# Patient Record
Sex: Female | Born: 1990 | Race: White | Hispanic: No | Marital: Single | Smoking: Never smoker
Health system: Southern US, Community
[De-identification: ages and names within clinical notes are randomized; demographics above are authoritative.]

## PROBLEM LIST (undated history)

## (undated) ENCOUNTER — Inpatient Hospital Stay (HOSPITAL_COMMUNITY): Payer: Self-pay

## (undated) DIAGNOSIS — I959 Hypotension, unspecified: Secondary | ICD-10-CM

## (undated) HISTORY — PX: DILATION AND CURETTAGE OF UTERUS: SHX78

---
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
## 2010-07-27 LAB — POCT URINALYSIS DIP (DEVICE)
Hgb urine dipstick: NEGATIVE
Nitrite: NEGATIVE
Urobilinogen, UA: 0.2 mg/dL (ref 0.0–1.0)
pH: 5.5 (ref 5.0–8.0)

## 2010-07-27 LAB — GC/CHLAMYDIA PROBE AMP, GENITAL: Chlamydia, DNA Probe: NEGATIVE

## 2010-07-27 LAB — HIV ANTIBODY (ROUTINE TESTING W REFLEX): HIV: NONREACTIVE

## 2011-12-01 ENCOUNTER — Emergency Department (HOSPITAL_COMMUNITY)
Admission: EM | Admit: 2011-12-01 | Discharge: 2011-12-01 | Disposition: A | Discharge status: Home / Self Care | Payer: Self-pay | Attending: Emergency Medicine | Admitting: Emergency Medicine

## 2011-12-01 ENCOUNTER — Encounter (HOSPITAL_COMMUNITY): Payer: Self-pay | Admitting: Emergency Medicine

## 2011-12-01 DIAGNOSIS — H6690 Otitis media, unspecified, unspecified ear: Secondary | ICD-10-CM | POA: Insufficient documentation

## 2011-12-01 DIAGNOSIS — J029 Acute pharyngitis, unspecified: Secondary | ICD-10-CM | POA: Insufficient documentation

## 2011-12-01 MED ORDER — IBUPROFEN 800 MG PO TABS: 800.0000 mg | ORAL_TABLET | Freq: Three times a day (TID) | ORAL | Status: AC

## 2011-12-01 MED ORDER — AMOXICILLIN 500 MG PO CAPS: 1000.0000 mg | ORAL_CAPSULE | Freq: Two times a day (BID) | ORAL | Status: AC

## 2011-12-01 NOTE — ED Notes (Signed)
Pt alert, arrives from home, c/o left ear pain, onset was this evening, resp even unlabored, skin pwd

## 2011-12-01 NOTE — ED Provider Notes (Signed)
History     CSN: [id number]  Arrival date & time 12/01/11  0502   First MD Initiated Contact with Patient 12/01/11 [phone]      Chief Complaint  Patient presents with  . Otalgia    (Consider location/radiation/quality/duration/timing/severity/associated sxs/prior treatment) HPI Comments: Kendra Gamble 21 y.o. female   The chief complaint is: Patient presents with:   Otalgia   The patient has medical history significant for:   History reviewed. No pertinent past medical history   Patient presents for sore throat that began last night and L ear pain that began this morning.She rates the pain 9/10. She reports having three ear infections as a adult and one that perforated. Denies ear discharge. Denies recent swimming Denies fever, chills. Denies NVD, abdominal pain.    The history is provided by the patient.    History reviewed. No pertinent past medical history.  History reviewed. No pertinent past surgical history.  No family history on file.  History  Substance Use Topics  . Smoking status: Never Smoker   . Smokeless tobacco: Not on file  . Alcohol Use: No    OB History    Grav Para Term Preterm Abortions TAB SAB Ect Mult Living                  Review of Systems  Constitutional: Negative for fever and chills.  HENT: Positive for ear pain and sore throat. Negative for hearing loss and ear discharge.   Gastrointestinal: Negative for nausea, vomiting, abdominal pain and diarrhea.  All other systems reviewed and are negative.    Allergies  Review of patient's allergies indicates no known allergies.  Home Medications   Current Outpatient Rx  Name Route Sig Dispense Refill  . ADULT MULTIVITAMIN W/MINERALS CH Oral Take 1 tablet by mouth daily.      BP 120/92  Pulse 76  Temp 98 F (36.7 C) (Oral)  Resp 16  Wt 97 lb (43.999 kg)  SpO2 98%  LMP 10/31/2011  Physical Exam  Nursing note and vitals reviewed. Constitutional: She appears well-developed  and well-nourished. No distress.  HENT:  Head: Normocephalic and atraumatic.  Right Ear: Hearing, tympanic membrane, external ear and ear canal normal.  Left Ear: Hearing, external ear and ear canal normal. Tympanic membrane is erythematous.  Ears:  Mouth/Throat: No oropharyngeal exudate.       Oropharynx mildly erythematous.  Eyes: Conjunctivae and EOM are normal. No scleral icterus.  Neck: Normal range of motion. Neck supple.  Cardiovascular: Normal rate, regular rhythm and normal heart sounds.   Pulmonary/Chest: Effort normal and breath sounds normal.  Abdominal: Soft. Bowel sounds are normal. There is no tenderness.  Lymphadenopathy:    She has no cervical adenopathy.  Skin: Skin is warm and dry.    ED Course  Procedures (including critical care time)  Labs Reviewed - No data to display No results found.   1. Otitis media       MDM  Patient presented with sore throat x 1 day and ear pain that began this am. Patient afebrile. Patient has history of  Adult otitis media and on physical exam L TM has mild injection. Patient discharged on ibuprofen and amoxicillin. No red flags or cholesteatoma, mastoiditis, or otitis externa. Return precautions given verbally and in discharge instructions.        Pixie Casino, PA-C 12/01/11 [phone]

## 2011-12-02 NOTE — ED Provider Notes (Signed)
Medical screening examination/treatment/procedure(s) were performed by non-physician practitioner and as supervising physician I was immediately available for consultation/collaboration.  Aldora Perman, MD 12/02/11 0816 

## 2012-11-29 ENCOUNTER — Emergency Department (INDEPENDENT_AMBULATORY_CARE_PROVIDER_SITE_OTHER)
Admission: EM | Admit: 2012-11-29 | Discharge: 2012-11-29 | Disposition: A | Discharge status: Home / Self Care | Payer: Self-pay | Source: Home / Self Care | Attending: Family Medicine | Admitting: Family Medicine

## 2012-11-29 ENCOUNTER — Encounter (HOSPITAL_COMMUNITY): Payer: Self-pay | Admitting: Emergency Medicine

## 2012-11-29 DIAGNOSIS — R42 Dizziness and giddiness: Secondary | ICD-10-CM

## 2012-11-29 HISTORY — DX: Hypotension, unspecified: I95.9

## 2012-11-29 MED ORDER — METAXALONE 800 MG PO TABS: 800.0000 mg | ORAL_TABLET | Freq: Three times a day (TID) | ORAL | Status: DC

## 2012-11-29 MED ORDER — IBUPROFEN 800 MG PO TABS: 800.0000 mg | ORAL_TABLET | Freq: Three times a day (TID) | ORAL | Status: DC
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
## 2015-05-05 ENCOUNTER — Inpatient Hospital Stay (HOSPITAL_COMMUNITY): Payer: Self-pay

## 2015-05-05 ENCOUNTER — Encounter (HOSPITAL_COMMUNITY): Payer: Self-pay | Admitting: Student

## 2015-05-05 ENCOUNTER — Inpatient Hospital Stay (HOSPITAL_COMMUNITY)
Admission: AD | Admit: 2015-05-05 | Discharge: 2015-05-05 | Disposition: A | Discharge status: Home / Self Care | Payer: Self-pay | Source: Ambulatory Visit | Attending: Obstetrics and Gynecology | Admitting: Obstetrics and Gynecology

## 2015-05-05 DIAGNOSIS — N760 Acute vaginitis: Secondary | ICD-10-CM | POA: Insufficient documentation

## 2015-05-05 DIAGNOSIS — O23591 Infection of other part of genital tract in pregnancy, first trimester: Secondary | ICD-10-CM | POA: Insufficient documentation

## 2015-05-05 DIAGNOSIS — A499 Bacterial infection, unspecified: Secondary | ICD-10-CM

## 2015-05-05 DIAGNOSIS — B9689 Other specified bacterial agents as the cause of diseases classified elsewhere: Secondary | ICD-10-CM

## 2015-05-05 DIAGNOSIS — R109 Unspecified abdominal pain: Secondary | ICD-10-CM

## 2015-05-05 DIAGNOSIS — O2391 Unspecified genitourinary tract infection in pregnancy, first trimester: Secondary | ICD-10-CM

## 2015-05-05 DIAGNOSIS — O418X10 Other specified disorders of amniotic fluid and membranes, first trimester, not applicable or unspecified: Secondary | ICD-10-CM

## 2015-05-05 DIAGNOSIS — O468X1 Other antepartum hemorrhage, first trimester: Secondary | ICD-10-CM

## 2015-05-05 DIAGNOSIS — Z3A01 Less than 8 weeks gestation of pregnancy: Secondary | ICD-10-CM | POA: Insufficient documentation

## 2015-05-05 DIAGNOSIS — O26899 Other specified pregnancy related conditions, unspecified trimester: Secondary | ICD-10-CM

## 2015-05-05 LAB — CBC WITH DIFFERENTIAL/PLATELET
BASOS ABS: 0 10*3/uL (ref 0.0–0.1)
Basophils Relative: 0 %
EOS PCT: 1 %
Eosinophils Absolute: 0.1 10*3/uL (ref 0.0–0.7)
HEMATOCRIT: 37 % (ref 36.0–46.0)
Hemoglobin: 12.8 g/dL (ref 12.0–15.0)
LYMPHS ABS: 1.7 10*3/uL (ref 0.7–4.0)
LYMPHS PCT: 17 %
MCH: 30.7 pg (ref 26.0–34.0)
MCHC: 34.6 g/dL (ref 30.0–36.0)
MCV: 88.7 fL (ref 78.0–100.0)
MONO ABS: 0.6 10*3/uL (ref 0.1–1.0)
MONOS PCT: 6 %
NEUTROS ABS: 7.5 10*3/uL (ref 1.7–7.7)
Neutrophils Relative %: 76 %
PLATELETS: 223 10*3/uL (ref 150–400)
RBC: 4.17 MIL/uL (ref 3.87–5.11)
RDW: 12.9 % (ref 11.5–15.5)
WBC: 9.9 10*3/uL (ref 4.0–10.5)

## 2015-05-05 LAB — URINALYSIS, ROUTINE W REFLEX MICROSCOPIC
Bilirubin Urine: NEGATIVE
GLUCOSE, UA: NEGATIVE mg/dL
HGB URINE DIPSTICK: NEGATIVE
Ketones, ur: NEGATIVE mg/dL
LEUKOCYTES UA: NEGATIVE
Nitrite: NEGATIVE
PH: 5.5 (ref 5.0–8.0)
PROTEIN: NEGATIVE mg/dL
SPECIFIC GRAVITY, URINE: 1.01 (ref 1.005–1.030)

## 2015-05-05 LAB — WET PREP, GENITAL
SPERM: NONE SEEN
Trich, Wet Prep: NONE SEEN
YEAST WET PREP: NONE SEEN

## 2015-05-05 LAB — HCG, QUANTITATIVE, PREGNANCY: hCG, Beta Chain, Quant, S: 10214 m[IU]/mL — ABNORMAL HIGH (ref <5)

## 2015-05-05 LAB — POCT PREGNANCY, URINE: PREG TEST UR: POSITIVE — AB

## 2015-05-05 MED ORDER — METRONIDAZOLE 500 MG PO TABS: 500.0000 mg | ORAL_TABLET | Freq: Two times a day (BID) | ORAL | Status: DC

## 2015-05-05 NOTE — Discharge Instructions (Signed)
First Trimester of Pregnancy The first trimester of pregnancy is from week 1 until the end of week 12 (months 1 through 3). During this time, your baby will begin to develop inside you. At 6-8 weeks, the eyes and face are formed, and the heartbeat can be seen on ultrasound. At the end of 12 weeks, all the baby's organs are formed. Prenatal care is all the medical care you receive before the birth of your baby. Make sure you get good prenatal care and follow all of your doctor's instructions. HOME CARE  Medicines  Take medicine only as told by your doctor. Some medicines are safe and some are not during pregnancy.  Take your prenatal vitamins as told by your doctor.  Take medicine that helps you poop (stool softener) as needed if your doctor says it is okay. Diet  Eat regular, healthy meals.  Your doctor will tell you the amount of weight gain that is right for you.  Avoid raw meat and uncooked cheese.  If you feel sick to your stomach (nauseous) or throw up (vomit):  Eat 4 or 5 small meals a day instead of 3 large meals.  Try eating a few soda crackers.  Drink liquids between meals instead of during meals.  If you have a hard time pooping (constipation):  Eat high-fiber foods like fresh vegetables, fruit, and whole grains.  Drink enough fluids to keep your pee (urine) clear or pale yellow. Activity and Exercise  Exercise only as told by your doctor. Stop exercising if you have cramps or pain in your lower belly (abdomen) or low back.  Try to avoid standing for long periods of time. Move your legs often if you must stand in one place for a long time.  Avoid heavy lifting.  Wear low-heeled shoes. Sit and stand up straight.  You can have sex unless your doctor tells you not to. Relief of Pain or Discomfort  Wear a good support bra if your breasts are sore.  Take warm water baths (sitz baths) to soothe pain or discomfort caused by hemorrhoids. Use hemorrhoid cream if your  doctor says it is okay.  Rest with your legs raised if you have leg cramps or low back pain.  Wear support hose if you have puffy, bulging veins (varicose veins) in your legs. Raise (elevate) your feet for 15 minutes, 3-4 times a day. Limit salt in your diet. Prenatal Care  Schedule your prenatal visits by the twelfth week of pregnancy.  Write down your questions. Take them to your prenatal visits.  Keep all your prenatal visits as told by your doctor. Safety  Wear your seat belt at all times when driving.  Make a list of emergency phone numbers. The list should include numbers for family, friends, the hospital, and police and fire departments. General Tips  Ask your doctor for a referral to a local prenatal class. Begin classes no later than at the start of month 6 of your pregnancy.  Ask for help if you need counseling or help with nutrition. Your doctor can give you advice or tell you where to go for help.  Do not use hot tubs, steam rooms, or saunas.  Do not douche or use tampons or scented sanitary pads.  Do not cross your legs for long periods of time.  Avoid litter boxes and soil used by cats.  Avoid all smoking, herbs, and alcohol. Avoid drugs not approved by your doctor.  Do not use any tobacco products, including cigarettes,  chewing tobacco, and electronic cigarettes. If you need help quitting, ask your doctor. You may get counseling or other support to help you quit.  Visit your dentist. At home, brush your teeth with a soft toothbrush. Be gentle when you floss. GET HELP IF:  You are dizzy.  You have mild cramps or pressure in your lower belly.  You have a nagging pain in your belly area.  You continue to feel sick to your stomach, throw up, or have watery poop (diarrhea).  You have a bad smelling fluid coming from your vagina.  You have pain with peeing (urination).  You have increased puffiness (swelling) in your face, hands, legs, or ankles. GET HELP  RIGHT AWAY IF:   You have a fever.  You are leaking fluid from your vagina.  You have spotting or bleeding from your vagina.  You have very bad belly cramping or pain.  You gain or lose weight rapidly.  You throw up blood. It may look like coffee grounds.  You are around people who have [place] measles, fifth disease, or chickenpox.  You have a very bad headache.  You have shortness of breath.  You have any kind of trauma, such as from a fall or a car accident.   This information is not intended to replace advice given to you by your health care provider. Make sure you discuss any questions you have with your health care provider.   Document Released: 09/14/2007 Document Revised: 04/18/2014 Document Reviewed: 02/05/2013 Elsevier Interactive Patient Education 2016 Elsevier Inc. Subchorionic Hematoma A subchorionic hematoma is a gathering of blood between the outer wall of the placenta and the inner wall of the womb (uterus). The placenta is the organ that connects the fetus to the wall of the uterus. The placenta performs the feeding, breathing (oxygen to the fetus), and waste removal (excretory work) of the fetus.  Subchorionic hematoma is the most common abnormality found on a result from ultrasonography done during the first trimester or early second trimester of pregnancy. If there has been little or no vaginal bleeding, early small hematomas usually shrink on their own and do not affect your baby or pregnancy. The blood is gradually absorbed over 1-2 weeks. When bleeding starts later in pregnancy or the hematoma is larger or occurs in an older pregnant woman, the outcome may not be as good. Larger hematomas may get bigger, which increases the chances for miscarriage. Subchorionic hematoma also increases the risk of premature detachment of the placenta from the uterus, preterm (premature) labor, and stillbirth. HOME CARE INSTRUCTIONS  Stay on bed rest if your health care provider  recommends this. Although bed rest will not prevent more bleeding or prevent a miscarriage, your health care provider may recommend bed rest until you are advised otherwise.  Avoid heavy lifting (more than 10 lb [4.5 kg]), exercise, sexual intercourse, or douching as directed by your health care provider.  Keep track of the number of pads you use each day and how soaked (saturated) they are. Write down this information.  Do not use tampons.  Keep all follow-up appointments as directed by your health care provider. Your health care provider may ask you to have follow-up blood tests or ultrasound tests or both. SEEK IMMEDIATE MEDICAL CARE IF:  You have severe cramps in your stomach, back, abdomen, or pelvis.  You have a fever.  You pass large clots or tissue. Save any tissue for your health care provider to look at.  Your bleeding increases or  you become lightheaded, feel weak, or have fainting episodes.   This information is not intended to replace advice given to you by your health care provider. Make sure you discuss any questions you have with your health care provider.   Document Released: 07/13/2006 Document Revised: 04/18/2014 Document Reviewed: 10/25/2012 Elsevier Interactive Patient Education [employer]2016 Elsevier Inc.

## 2015-05-05 NOTE — MAU Note (Signed)
Pt reports she has been cramping off/on for a week and today in worsened but not it has gone away. Denies bleeding. Positive preg test last week.

## 2015-05-05 NOTE — MAU Provider Note (Signed)
History     CSN: [id number]  Arrival date and time: 05/05/15 1919   First Provider Initiated Contact with Patient 05/05/15 2107      Chief Complaint  Patient presents with  . Abdominal Pain   HPI [name] is a 25 y.o. G1P0 at [redacted]w[redacted]d who presents to MAU today with complaint of lower abdominal pain. The patient states that pain has been intermittent over the last few days. She denies pain now, but states that earlier while at work the pain was moderate. She states frequent nausea, but denies vomiting or diarrhea. She has not taken anything for pain. She denies UTI symptoms or fever.   OB History    Gravida Para Term Preterm AB TAB SAB Ectopic Multiple Living   1               Past Medical History  Diagnosis Date  . Hypotension     History reviewed. No pertinent past surgical history.  History reviewed. No pertinent family history.  Social History  Substance Use Topics  . Smoking status: Never Smoker   . Smokeless tobacco: None  . Alcohol Use: Yes    Allergies:  Allergies  Allergen Reactions  . Banana Itching    Itching is of the mouth and throat.  . Lactose Intolerance (Gi) Diarrhea and Nausea Only  . Latex Rash    No prescriptions prior to admission    Review of Systems  Constitutional: Negative for fever and malaise/fatigue.  Gastrointestinal: Positive for nausea and abdominal pain. Negative for vomiting, diarrhea and constipation.  Genitourinary: Negative for dysuria, urgency and frequency.       Neg -vaginal bleeding, discharge   Physical Exam   Blood pressure 126/72, pulse 113, temperature 98.8 F (37.1 C), temperature source Oral, resp. rate 16, height  (1.6 m), weight 110 lb (49.896 kg), last menstrual period 03/27/2015, SpO2 100 %.  Physical Exam  Nursing note and vitals reviewed. Constitutional: She is oriented to person, place, and time. She appears well-developed and well-nourished. No distress.  HENT:  Head: Normocephalic and  atraumatic.  Cardiovascular: Normal rate.   Respiratory: Effort normal.  GI: Soft. She exhibits no distension and no mass. There is no tenderness. There is no rebound and no guarding.  Genitourinary: Uterus is not enlarged and not tender. Cervix exhibits no motion tenderness, no discharge and no friability. Right adnexum displays no mass and no tenderness. Left adnexum displays no mass and no tenderness. No bleeding in the vagina. Vaginal discharge (scant thin, white discharge noted) found.  Neurological: She is alert and oriented to person, place, and time.  Skin: Skin is warm and dry. No erythema.  Psychiatric: She has a normal mood and affect.   Results for orders placed or performed during the hospital encounter of 05/05/15 (from the past 24 hour(s))  Urinalysis, Routine w reflex microscopic (not at [facility])     Status: None   Collection Time: 05/05/15  7:50 PM  Result Value Ref Range   Color, Urine YELLOW YELLOW   APPearance CLEAR CLEAR   Specific Gravity, Urine 1.010 1.005 - 1.030   pH 5.5 5.0 - 8.0   Glucose, UA NEGATIVE NEGATIVE mg/dL   Hgb urine dipstick NEGATIVE NEGATIVE   Bilirubin Urine NEGATIVE NEGATIVE   Ketones, ur NEGATIVE NEGATIVE mg/dL   Protein, ur NEGATIVE NEGATIVE mg/dL   Nitrite NEGATIVE NEGATIVE   Leukocytes, UA NEGATIVE NEGATIVE  Pregnancy, urine POC     Status: Abnormal   Collection  Time: 05/05/15  8:08 PM  Result Value Ref Range   Preg Test, Ur POSITIVE (A) NEGATIVE  CBC with Differential/Platelet     Status: None   Collection Time: 05/05/15  8:28 PM  Result Value Ref Range   WBC 9.9 4.0 - 10.5 K/uL   RBC 4.17 3.87 - 5.11 MIL/uL   Hemoglobin 12.8 12.0 - 15.0 g/dL   HCT 16.1 09.6 - 04.5 %   MCV 88.7 78.0 - 100.0 fL   MCH 30.7 26.0 - 34.0 pg   MCHC 34.6 30.0 - 36.0 g/dL   RDW 40.9 81.1 - 91.4 %   Platelets 223 150 - 400 K/uL   Neutrophils Relative % 76 %   Neutro Abs 7.5 1.7 - 7.7 K/uL   Lymphocytes Relative 17 %   Lymphs Abs 1.7 0.7 - 4.0 K/uL    Monocytes Relative 6 %   Monocytes Absolute 0.6 0.1 - 1.0 K/uL   Eosinophils Relative 1 %   Eosinophils Absolute 0.1 0.0 - 0.7 K/uL   Basophils Relative 0 %   Basophils Absolute 0.0 0.0 - 0.1 K/uL  ABO/Rh     Status: None (Preliminary result)   Collection Time: 05/05/15  8:28 PM  Result Value Ref Range   ABO/RH(D) O POS   hCG, quantitative, pregnancy     Status: Abnormal   Collection Time: 05/05/15  8:28 PM  Result Value Ref Range   hCG, Beta Chain, Quant, S 10214 (H) <5 mIU/mL  Wet prep, genital     Status: Abnormal   Collection Time: 05/05/15  9:00 PM  Result Value Ref Range   Yeast Wet Prep HPF POC NONE SEEN NONE SEEN   Trich, Wet Prep NONE SEEN NONE SEEN   Clue Cells Wet Prep HPF POC PRESENT (A) NONE SEEN   WBC, Wet Prep HPF POC MODERATE (A) NONE SEEN   Sperm NONE SEEN     US Ob Comp Less 14 Wks  05/05/2015  CLINICAL DATA:  Intermittent cramping, first trimester pregnancy with quantitative beta HCG 10,214 EXAM: OBSTETRIC <14 WK [place] AND TRANSVAGINAL OB US TECHNIQUE: Both transabdominal and transvaginal ultrasound examinations were performed for complete evaluation of the gestation as well as the maternal uterus, adnexal regions, and pelvic cul-de-sac. Transvaginal technique was performed to assess early pregnancy. COMPARISON:  None. FINDINGS: Intrauterine gestational sac: Visualized/normal in shape. Yolk sac:  Present Embryo:  Not seen Cardiac Activity: Not seen MSD: 8  mm   5 w   4  d Subchorionic hemorrhage:  Tiny subchorionic hemorrhage possible. Maternal uterus/adnexae: Left ovary is normal. Right ovary shows adjacent 2.5 and 2.4 cm complex cystic lesions 1 or both of which may be related to corpus luteum. Trace free fluid in the pelvis. IMPRESSION: Probable early intrauterine gestational sac, but no fetal pole or cardiac activity yet visualized. Recommend follow-up quantitative B-HCG levels and follow-up US in 14 days to confirm and assess viability. This recommendation follows SRU  consensus guidelines: Diagnostic Criteria for Nonviable Pregnancy Early in the First Trimester. Malva Limes Med 2013; 782:9562-13. Electronically Signed   By: Esperanza Heir M.D.   On: 05/05/2015 22:02   US Ob Transvaginal  05/05/2015  CLINICAL DATA:  Intermittent cramping, first trimester pregnancy with quantitative beta HCG 10,214 EXAM: OBSTETRIC <14 WK [place] AND TRANSVAGINAL OB US TECHNIQUE: Both transabdominal and transvaginal ultrasound examinations were performed for complete evaluation of the gestation as well as the maternal uterus, adnexal regions, and pelvic cul-de-sac. Transvaginal technique was performed to assess early  pregnancy. COMPARISON:  None. FINDINGS: Intrauterine gestational sac: Visualized/normal in shape. Yolk sac:  Present Embryo:  Not seen Cardiac Activity: Not seen MSD: 8  mm   5 w   4  d Subchorionic hemorrhage:  Tiny subchorionic hemorrhage possible. Maternal uterus/adnexae: Left ovary is normal. Right ovary shows adjacent 2.5 and 2.4 cm complex cystic lesions 1 or both of which may be related to corpus luteum. Trace free fluid in the pelvis. IMPRESSION: Probable early intrauterine gestational sac, but no fetal pole or cardiac activity yet visualized. Recommend follow-up quantitative B-HCG levels and follow-up US in 14 days to confirm and assess viability. This recommendation follows SRU consensus guidelines: Diagnostic Criteria for Nonviable Pregnancy Early in the First Trimester. Malva Limes Med 2013; 161:0960-45. Electronically Signed   By: Esperanza Heir M.D.   On: 05/05/2015 22:02    MAU Course  Procedures None  MDM +UPT UA, wet prep, GC/chlamydia, CBC, ABO/Rh, quant hCG, HIV, RPR and [place] today to rule out ectopic pregnancy Patient reported to sonographer that she had taken morning after pill on 04/12/15 - discussed with patient.  Patient states that she is unsure if she will proceed with the pregnancy. She does have support from FOB if she decides to continue. She has an  appointment with abortion clinic to discuss options.  Assessment and Plan  A: IUGS with YS at [redacted]w[redacted]d Bacterial vaginosis  P: Discharge home Rx for Flagyl given to patient First trimester precautions discussed Patient advised to follow-up with OB provider of choice Patient may return to MAU as needed or if her condition were to change or worsen   Marny Lowenstein, PA-C  05/05/2015, 10:13 PM

## 2015-05-06 LAB — ABO/RH: ABO/RH(D): O POS

## 2015-05-06 LAB — RPR: RPR: NONREACTIVE

## 2015-05-06 LAB — GC/CHLAMYDIA PROBE AMP (~~LOC~~) NOT AT ARMC
CHLAMYDIA, DNA PROBE: NEGATIVE
NEISSERIA GONORRHEA: NEGATIVE

## 2015-05-06 LAB — HIV ANTIBODY (ROUTINE TESTING W REFLEX): HIV SCREEN 4TH GENERATION: NONREACTIVE

## 2016-03-09 ENCOUNTER — Encounter (HOSPITAL_COMMUNITY): Payer: Self-pay

## 2017-02-08 ENCOUNTER — Ambulatory Visit (INDEPENDENT_AMBULATORY_CARE_PROVIDER_SITE_OTHER): Payer: BLUE CROSS/BLUE SHIELD | Admitting: Family Medicine

## 2017-02-08 ENCOUNTER — Encounter: Payer: Self-pay | Admitting: Family Medicine

## 2017-02-08 VITALS — BP 113/77 | HR 79 | Wt 113.8 lb

## 2017-02-08 DIAGNOSIS — Z124 Encounter for screening for malignant neoplasm of cervix: Secondary | ICD-10-CM | POA: Diagnosis not present

## 2017-02-08 DIAGNOSIS — Z01419 Encounter for gynecological examination (general) (routine) without abnormal findings: Secondary | ICD-10-CM | POA: Diagnosis not present

## 2017-02-08 DIAGNOSIS — Z3009 Encounter for other general counseling and advice on contraception: Secondary | ICD-10-CM

## 2017-02-08 DIAGNOSIS — Z113 Encounter for screening for infections with a predominantly sexual mode of transmission: Secondary | ICD-10-CM | POA: Diagnosis not present

## 2017-02-08 DIAGNOSIS — N898 Other specified noninflammatory disorders of vagina: Secondary | ICD-10-CM | POA: Diagnosis not present

## 2017-02-08 MED ORDER — SPRINTEC 28 0.25-35 MG-MCG PO TABS: 1.0000 | ORAL_TABLET | Freq: Every day | ORAL | 11 refills | Status: AC

## 2017-02-08 NOTE — Progress Notes (Signed)
   CLINIC ENCOUNTER NOTE  History:  26 y.o. G1P0 here today for spotting in her discharge for 3 days. No itching or burning. No dysuria. Takes OCP and missed 2 pills in the past week. Not sexually active. Last sexual activity was several months ago. She reports a TAB at 6 weeks with DC several months ago.  She denies any bleeding, pelvic pain or other concerns.   Past Medical History:  Diagnosis Date  . Hypotension     Past Surgical History:  Procedure Laterality Date  . DILATION AND CURETTAGE OF UTERUS     TAB of 6 week pregnancy    The following portions of the patient's history were reviewed and updated as appropriate: allergies, current medications, past family history, past medical history, past social history, past surgical history and problem list.   Health Maintenance:  Normal pap per report  Review of Systems:  Pertinent items noted in HPI and remainder of comprehensive ROS otherwise negative.   Objective:  Physical Exam BP 113/77   Pulse 79   Wt 113 lb 12.8 oz (51.6 kg)   LMP 01/29/2017 (Approximate)   BMI 20.16 kg/m  CONSTITUTIONAL: Well-developed, well-nourished female in no acute distress.  HENT:  Normocephalic, atraumatic. External right and left ear normal. Oropharynx is clear and moist EYES: Conjunctivae and EOM are normal. Pupils are equal, round, and reactive to light. No scleral icterus.  NECK: Normal range of motion, supple, no masses SKIN: Skin is warm and dry. No rash noted. Not diaphoretic. No erythema. No pallor. NEUROLGIC: Alert and oriented to person, place, and time. Normal reflexes, muscle tone coordination. No cranial nerve deficit noted. PSYCHIATRIC: Normal mood and affect. Normal behavior. Normal judgment and thought content. CARDIOVASCULAR: Normal heart rate noted RESPIRATORY: Effort and breath sounds normal, no problems with respiration noted BREAST: Skin normal in appearance. Symmetric breasts with everted nipples bilaterally. No nipple  discharge. No palpable masses bilaterally.  ABDOMEN: Soft, no distention noted.   PELVIC: Normal appearing external genitalia; normal appearing vaginal mucosa and cervix. +white discharge with fishy odor noted.  Normal uterine size, no other palpable masses, no uterine or adnexal tenderness. MUSCULOSKELETAL: Normal range of motion. No edema noted.  Labs and Imaging No results found.  Assessment & Plan:  1. Screening examination for STD (sexually transmitted disease) - Cytology - PAP - HIV antibody - RPR  2. Encounter for general counseling on prescription of oral contraceptives - Desires OCP, counseled on LARC - SPRINTEC 28 0.25-35 MG-MCG tablet; Take 1 tablet by mouth daily.  Dispense: 1 Package; Refill: 11  3. Well woman exam with routine gynecological exam Pap today Reviewed reproductive life plans and timing of pregnancy Discussed folic acid use to prevent birth defects STI screenings today Discussed breast awareness.    Routine preventative health maintenance measures emphasized. Please refer to After Visit Summary for other counseling recommendations.   Return in about 1 year (around 02/08/2018) for Yearly wellness exam.

## 2017-02-08 NOTE — Progress Notes (Signed)
Declined flu injection at this time

## 2017-02-09 LAB — HIV ANTIBODY (ROUTINE TESTING W REFLEX): HIV Screen 4th Generation wRfx: NONREACTIVE

## 2017-02-09 LAB — RPR: RPR: NONREACTIVE

## 2017-02-10 LAB — CYTOLOGY - PAP
BACTERIAL VAGINITIS: POSITIVE — AB
CANDIDA VAGINITIS: NEGATIVE
CHLAMYDIA, DNA PROBE: NEGATIVE
DIAGNOSIS: NEGATIVE
Neisseria Gonorrhea: NEGATIVE
Trichomonas: NEGATIVE

## 2017-02-12 ENCOUNTER — Encounter: Payer: Self-pay | Admitting: Family Medicine

## 2017-02-12 MED ORDER — METRONIDAZOLE 500 MG PO TABS: 500.0000 mg | ORAL_TABLET | Freq: Two times a day (BID) | ORAL | 0 refills | Status: DC

## 2017-02-16 ENCOUNTER — Other Ambulatory Visit: Payer: Self-pay

## 2017-02-16 MED ORDER — METRONIDAZOLE 500 MG PO TABS: 500.0000 mg | ORAL_TABLET | Freq: Two times a day (BID) | ORAL | 0 refills | Status: AC

## 2018-03-02 ENCOUNTER — Other Ambulatory Visit: Payer: Self-pay | Admitting: Family Medicine

## 2018-03-02 DIAGNOSIS — Z3009 Encounter for other general counseling and advice on contraception: Secondary | ICD-10-CM

## 2019-05-20 ENCOUNTER — Encounter (HOSPITAL_COMMUNITY): Payer: Self-pay | Admitting: Emergency Medicine

## 2019-05-20 ENCOUNTER — Other Ambulatory Visit: Payer: Self-pay

## 2019-05-20 ENCOUNTER — Emergency Department (HOSPITAL_COMMUNITY): Payer: 59

## 2019-05-20 ENCOUNTER — Emergency Department (HOSPITAL_COMMUNITY)
Admission: EM | Admit: 2019-05-20 | Discharge: 2019-05-20 | Disposition: A | Discharge status: Home / Self Care | Payer: 59 | Attending: Emergency Medicine | Admitting: Emergency Medicine

## 2019-05-20 DIAGNOSIS — M25531 Pain in right wrist: Secondary | ICD-10-CM | POA: Diagnosis not present

## 2019-05-20 DIAGNOSIS — M549 Dorsalgia, unspecified: Secondary | ICD-10-CM | POA: Diagnosis not present

## 2019-05-20 DIAGNOSIS — Y93I9 Activity, other involving external motion: Secondary | ICD-10-CM | POA: Insufficient documentation

## 2019-05-20 DIAGNOSIS — Z79899 Other long term (current) drug therapy: Secondary | ICD-10-CM | POA: Diagnosis not present

## 2019-05-20 DIAGNOSIS — R0789 Other chest pain: Secondary | ICD-10-CM | POA: Insufficient documentation

## 2019-05-20 DIAGNOSIS — M542 Cervicalgia: Secondary | ICD-10-CM | POA: Insufficient documentation

## 2019-05-20 DIAGNOSIS — Z91040 Latex allergy status: Secondary | ICD-10-CM | POA: Diagnosis not present

## 2019-05-20 DIAGNOSIS — Y92410 Unspecified street and highway as the place of occurrence of the external cause: Secondary | ICD-10-CM | POA: Insufficient documentation

## 2019-05-20 DIAGNOSIS — Y999 Unspecified external cause status: Secondary | ICD-10-CM | POA: Insufficient documentation

## 2019-05-20 MED ORDER — IBUPROFEN 600 MG PO TABS: 600.0000 mg | ORAL_TABLET | Freq: Four times a day (QID) | ORAL | 0 refills | Status: AC | PRN

## 2019-05-20 MED ORDER — METHOCARBAMOL 500 MG PO TABS: 500.0000 mg | ORAL_TABLET | Freq: Two times a day (BID) | ORAL | 0 refills | Status: AC

## 2019-05-20 MED ORDER — IBUPROFEN 400 MG PO TABS
600.0000 mg | ORAL_TABLET | Freq: Once | ORAL | Status: AC
  Administered 2019-05-20: 600 mg via ORAL
  Filled 2019-05-20: qty 1
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
## 2019-05-20 NOTE — Discharge Instructions (Signed)
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
## 2020-03-04 ENCOUNTER — Ambulatory Visit (HOSPITAL_COMMUNITY)
Admission: EM | Admit: 2020-03-04 | Discharge: 2020-03-04 | Disposition: A | Discharge status: Home / Self Care | Payer: 59 | Attending: Family Medicine | Admitting: Family Medicine

## 2020-03-04 ENCOUNTER — Other Ambulatory Visit: Payer: Self-pay

## 2020-03-04 ENCOUNTER — Encounter (HOSPITAL_COMMUNITY): Payer: Self-pay

## 2020-03-04 DIAGNOSIS — J069 Acute upper respiratory infection, unspecified: Secondary | ICD-10-CM | POA: Insufficient documentation

## 2020-03-04 DIAGNOSIS — Z20822 Contact with and (suspected) exposure to covid-19: Secondary | ICD-10-CM | POA: Diagnosis not present

## 2020-03-04 DIAGNOSIS — R112 Nausea with vomiting, unspecified: Secondary | ICD-10-CM | POA: Diagnosis not present

## 2020-03-04 MED ORDER — ONDANSETRON 4 MG PO TBDP: 4.0000 mg | ORAL_TABLET | Freq: Three times a day (TID) | ORAL | 0 refills | Status: AC | PRN

## 2020-03-04 NOTE — ED Triage Notes (Signed)
Pt presents with runny nose, nasal congestion, sneezing, sore throat, bodyaches, headaches, earaches, fatigue and slight cough X 2 days. Pt states she felt nauseas this morning, she states It was yellow. Pt denies SOB.

## 2020-03-04 NOTE — ED Provider Notes (Signed)
[facility]    CSN: [id number] Arrival date & time: 03/04/20  1641      History   Chief Complaint Chief Complaint  Patient presents with  . Nasal Congestion  . Cough  . Sore Throat  . Otalgia  . Nausea    HPI Kendra Gamble is a 29 y.o. female.   Presenting today with 1 day hx of cough, runny nose, sneezing, sore throat, b/l ear pain, N/V. Denies fever, chills, body aches, CP, SOB, abdominal pain, diarrhea. So far tried sudafed without benefit. Does have a hx of seasonal allergies, not currently on anything for this. Denies sick contacts.      Past Medical History:  Diagnosis Date  . Hypotension     There are no problems to display for this patient.   Past Surgical History:  Procedure Laterality Date  . DILATION AND CURETTAGE OF UTERUS     TAB of 6 week pregnancy    OB History    Gravida  1   Para      Term      Preterm      AB  1   Living        SAB      TAB  1   Ectopic      Multiple      Live Births               Home Medications    Prior to Admission medications   Medication Sig Start Date End Date Taking? Authorizing Provider  ibuprofen (ADVIL) 600 MG tablet Take 1 tablet (600 mg total) by mouth every 6 (six) hours as needed. 05/20/19   Dartha Lodge, PA-C  methocarbamol (ROBAXIN) 500 MG tablet Take 1 tablet (500 mg total) by mouth 2 (two) times daily. 05/20/19   Dartha Lodge, PA-C  metroNIDAZOLE (FLAGYL) 500 MG tablet Take 1 tablet (500 mg total) 2 (two) times daily by mouth. 02/16/17   Federico Flake, MD  ondansetron (ZOFRAN ODT) 4 MG disintegrating tablet Take 1 tablet (4 mg total) by mouth every 8 (eight) hours as needed for nausea or vomiting. 03/04/20   Particia Nearing, PA-C  SPRINTEC 28 0.25-35 MG-MCG tablet Take 1 tablet by mouth daily. 02/08/17   Federico Flake, MD    Family History History reviewed. No pertinent family history.  Social History Social History   Tobacco Use  .  Smoking status: Never Smoker  . Smokeless tobacco: Never Used  Substance Use Topics  . Alcohol use: Yes  . Drug use: Not on file     Allergies   Banana, Lactose intolerance (gi), and Latex   Review of Systems Review of Systems PER HPI   Physical Exam Triage Vital Signs ED Triage Vitals  Enc Vitals Group     BP 03/04/20 1707 122/78     Pulse Rate 03/04/20 1707 100     Resp 03/04/20 1707 18     Temp 03/04/20 1707 99.3 F (37.4 C)     Temp Source 03/04/20 1707 Oral     SpO2 03/04/20 1707 98 %     Weight --      Height --      Head Circumference --      Peak Flow --      Pain Score 03/04/20 1705 6     Pain Loc --      Pain Edu? --      Excl. in GC? --  No data found.  Updated Vital Signs BP 122/78 (BP Location: Left Arm)   Pulse 100   Temp 99.3 F (37.4 C) (Oral)   Resp 18   LMP 02/24/2020 (Approximate)   SpO2 98%   Visual Acuity Right Eye Distance:   Left Eye Distance:   Bilateral Distance:    Right Eye Near:   Left Eye Near:    Bilateral Near:     Physical Exam Vitals and nursing note reviewed.  Constitutional:      Appearance: Normal appearance. She is not ill-appearing.  HENT:     Head: Atraumatic.     Ears:     Comments: B/l middle ear effusion, no TM injection or erythema    Nose: Rhinorrhea present.     Mouth/Throat:     Mouth: Mucous membranes are moist.     Pharynx: Oropharynx is clear. Posterior oropharyngeal erythema present.  Eyes:     Extraocular Movements: Extraocular movements intact.     Conjunctiva/sclera: Conjunctivae normal.  Cardiovascular:     Rate and Rhythm: Normal rate and regular rhythm.     Heart sounds: Normal heart sounds.  Pulmonary:     Effort: Pulmonary effort is normal.     Breath sounds: Normal breath sounds. No wheezing or rales.  Abdominal:     General: Bowel sounds are normal. There is no distension.     Palpations: Abdomen is soft.     Tenderness: There is no abdominal tenderness. There is no guarding.   Musculoskeletal:        General: Normal range of motion.     Cervical back: Normal range of motion and neck supple.  Skin:    General: Skin is warm and dry.  Neurological:     Mental Status: She is alert and oriented to person, place, and time.  Psychiatric:        Mood and Affect: Mood normal.        Thought Content: Thought content normal.        Judgment: Judgment normal.      UC Treatments / Results  Labs (all labs ordered are listed, but only abnormal results are displayed) Labs Reviewed  SARS CORONAVIRUS 2 (TAT 6-24 HRS)    EKG   Radiology No results found.  Procedures Procedures (including critical care time)  Medications Ordered in UC Medications - No data to display  Initial Impression / Assessment and Plan / UC Course  I have reviewed the triage vital signs and the nursing notes.  Pertinent labs & imaging results that were available during my care of the patient were reviewed by me and considered in my medical decision making (see chart for details).     Exam, vitals reassuring. COVID pcr pending, isolation protocol reviewed. Zofran sent for N/V and discussed OTC symptomatic medications and home care. Return if worsening or not resolving.   Final Clinical Impressions(s) / UC Diagnoses   Final diagnoses:  Viral URI with cough  Non-intractable vomiting with nausea, unspecified vomiting type   Discharge Instructions   None    ED Prescriptions    Medication Sig Dispense Auth. Provider   ondansetron (ZOFRAN ODT) 4 MG disintegrating tablet Take 1 tablet (4 mg total) by mouth every 8 (eight) hours as needed for nausea or vomiting. 21 tablet Particia Nearing, [place]     PDMP not reviewed this encounter.   Particia Nearing, [place] 03/04/20 1835

## 2020-03-05 LAB — SARS CORONAVIRUS 2 (TAT 6-24 HRS): SARS Coronavirus 2: NEGATIVE

## 2020-08-24 ENCOUNTER — Other Ambulatory Visit: Payer: Self-pay | Admitting: Physician Assistant

## 2020-08-24 DIAGNOSIS — R2232 Localized swelling, mass and lump, left upper limb: Secondary | ICD-10-CM

## 2020-09-25 ENCOUNTER — Ambulatory Visit
Admission: RE | Admit: 2020-09-25 | Discharge: 2020-09-25 | Disposition: A | Discharge status: Home / Self Care | Payer: 59 | Source: Ambulatory Visit | Attending: Physician Assistant | Admitting: Physician Assistant

## 2020-09-25 ENCOUNTER — Other Ambulatory Visit: Payer: Self-pay

## 2020-09-25 DIAGNOSIS — R2232 Localized swelling, mass and lump, left upper limb: Secondary | ICD-10-CM

## 2022-12-01 IMAGING — MG DIGITAL DIAGNOSTIC BILAT W/ TOMO W/ CAD
6 of 10 series · 6 of 30 positions shown · non-contrast
Comparison: None.

CLINICAL DATA: 29-year-old female presenting with a lump in the
left axilla for approximately 1 year. Patient said she first noticed
the lump after a COVID vaccination. She states it feels a little bit
smaller recently.

EXAM:
DIGITAL DIAGNOSTIC BILATERAL MAMMOGRAM WITH TOMOSYNTHESIS AND CAD;
US AXILLARY LEFT
TECHNIQUE: Bilateral digital diagnostic mammography and breast tomosynthesis
was performed. The images were evaluated with computer-aided
detection.; Targeted ultrasound examination of the left axilla was
performed.

[R CC synth-2D]
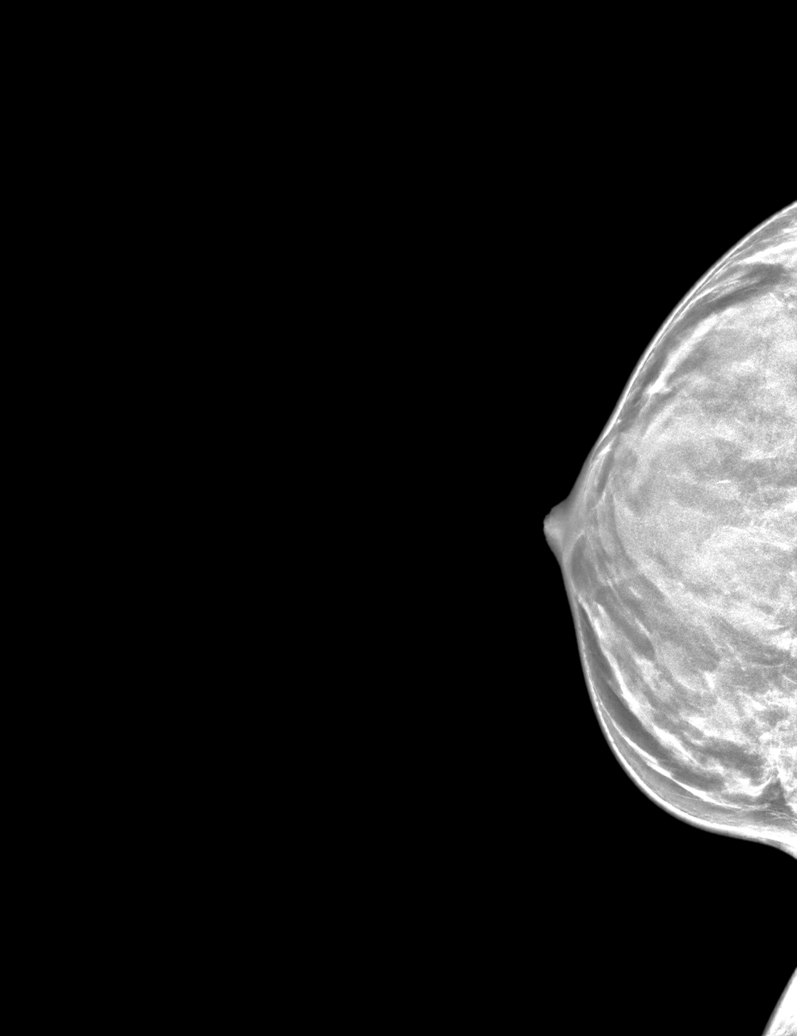

[L MLO synth-2D (1 of 2)]
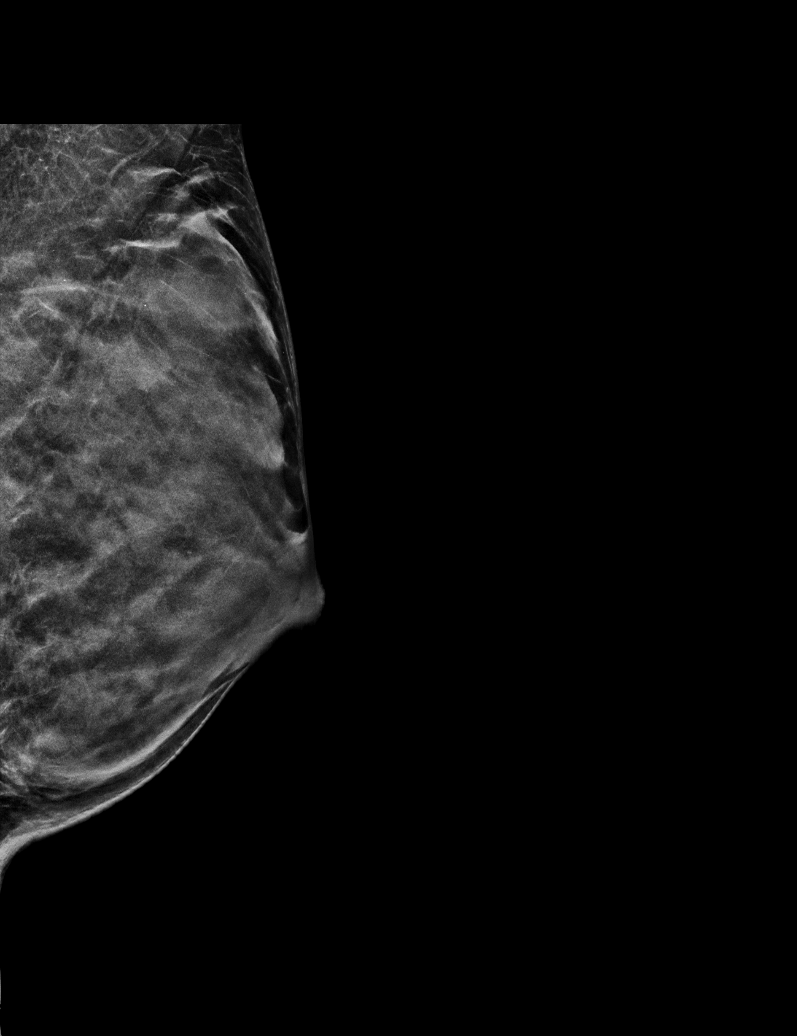

[R MLO synth-2D]
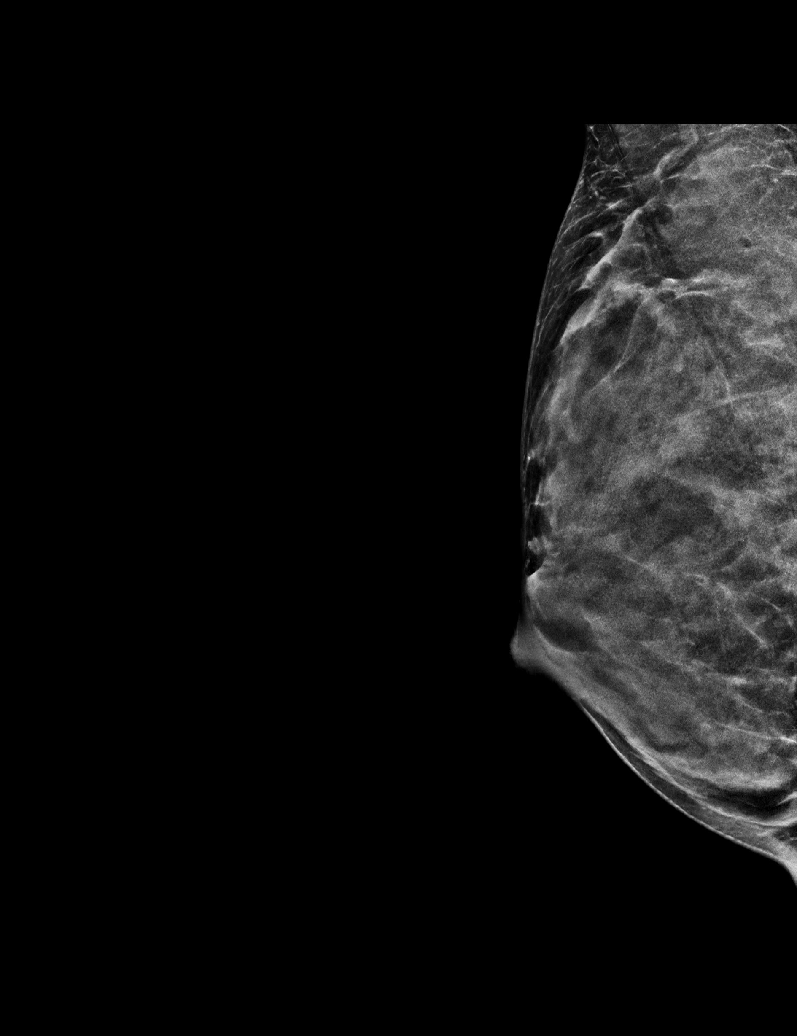

[L MLO synth-2D (2 of 2)]
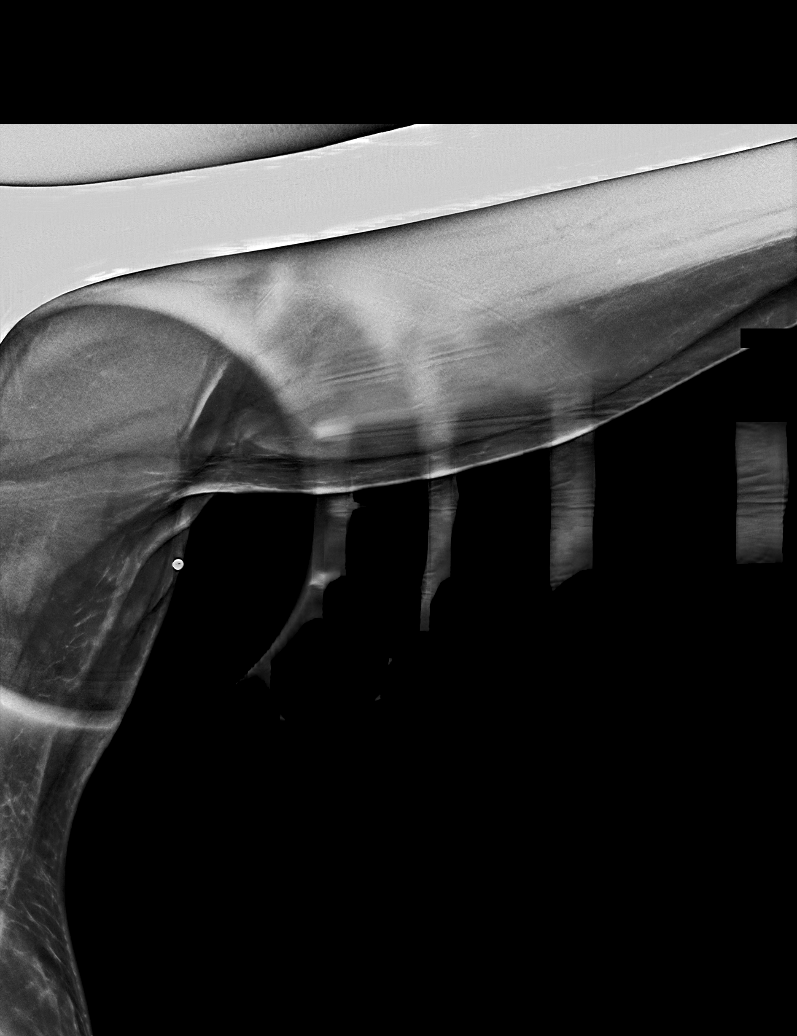

[L CC synth-2D]
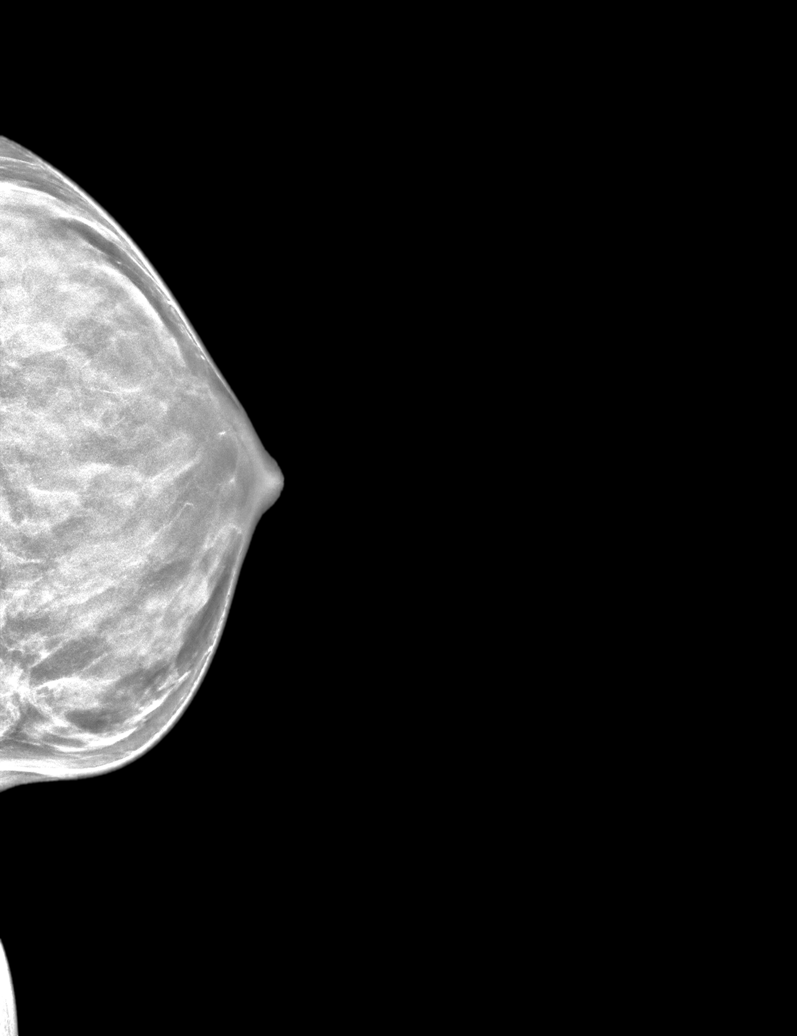

[L MLO tomo · tomo slice 23/45.0]
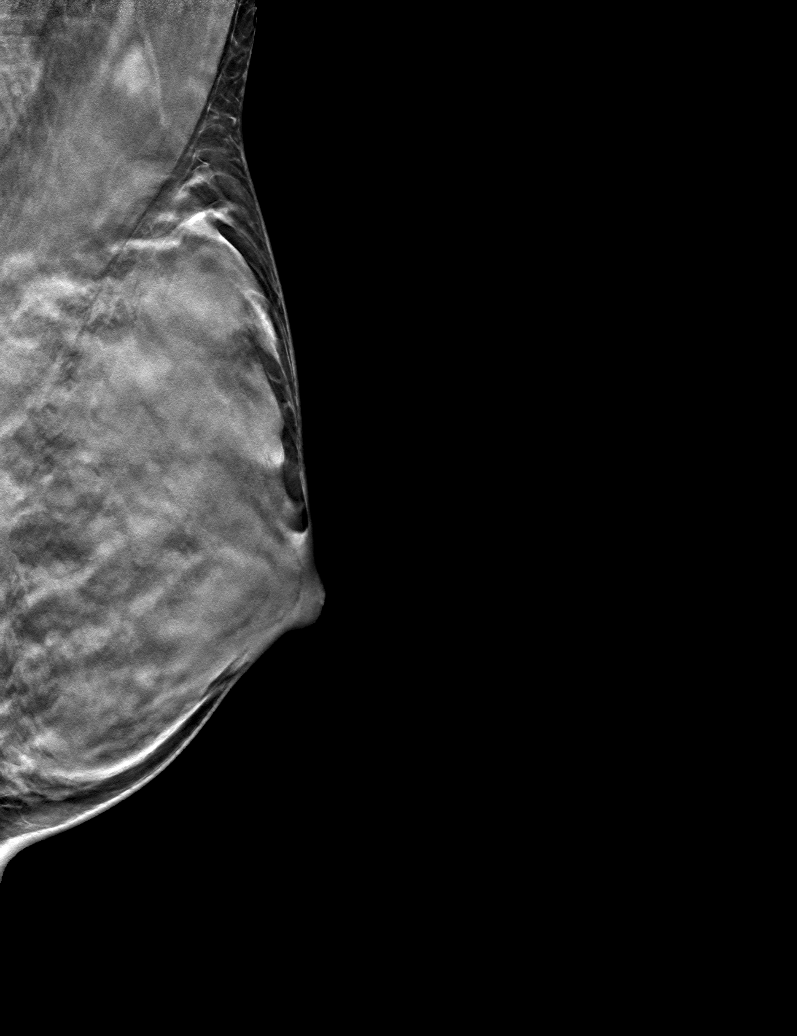

[6 of 30 positions shown; findings below may reference images not displayed]

ACR Breast Density Category d: The breast tissue is extremely dense,
which lowers the sensitivity of mammography.
FINDINGS: Mammogram:

Right breast: No suspicious mass, distortion, or microcalcifications
are identified to suggest presence of malignancy.

Left breast: No suspicious mass, distortion, or microcalcifications
are identified to suggest presence of malignancy.

A skin BB marks the site of concern reported by the patient in the
left axilla. A spot tangential view of this area is performed in
addition to standard views demonstrating a superficial circumscribed
oval mass which likely represents a lymph node.

Ultrasound:

Targeted ultrasound performed at the palpable site of concern in the
left axilla demonstrating a superficial oval circumscribed
hypoechoic mass with central fatty hilum consistent with a benign
lymph node. There is normal cortical thickness. No suspicious solid
mass or abnormal lymph node identified.
IMPRESSION: At the palpable site of concern in the left axilla there is a normal
lymph node.

RECOMMENDATION:
1.  Continued clinical surveillance of the bilateral breasts.

2.  Begin routine annual screening mammography at age 39.

I have discussed the findings and recommendations with the patient.
If applicable, a reminder letter will be sent to the patient
regarding the next appointment.

BI-RADS CATEGORY  2: Benign.

## 2022-12-01 IMAGING — US US AXILLARY LEFT
1 series · 6 of 6 positions shown · non-contrast
Comparison: None.

CLINICAL DATA: 29-year-old female presenting with a lump in the
left axilla for approximately 1 year. Patient said she first noticed
the lump after a COVID vaccination. She states it feels a little bit
smaller recently.

EXAM:
DIGITAL DIAGNOSTIC BILATERAL MAMMOGRAM WITH TOMOSYNTHESIS AND CAD;
US AXILLARY LEFT
TECHNIQUE: Bilateral digital diagnostic mammography and breast tomosynthesis
was performed. The images were evaluated with computer-aided
detection.; Targeted ultrasound examination of the left axilla was
performed.

[Series 1: us axillary left · 0.07mm/px · 6 of 6 slices shown]
[im 1/6]
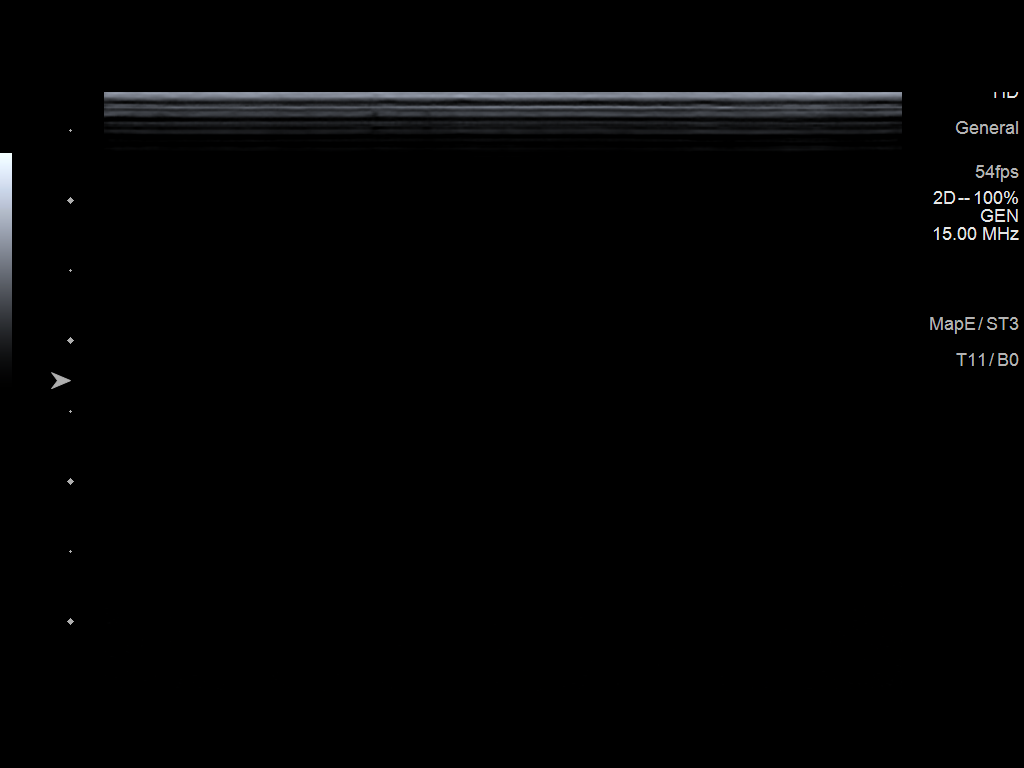
[im 2/6]
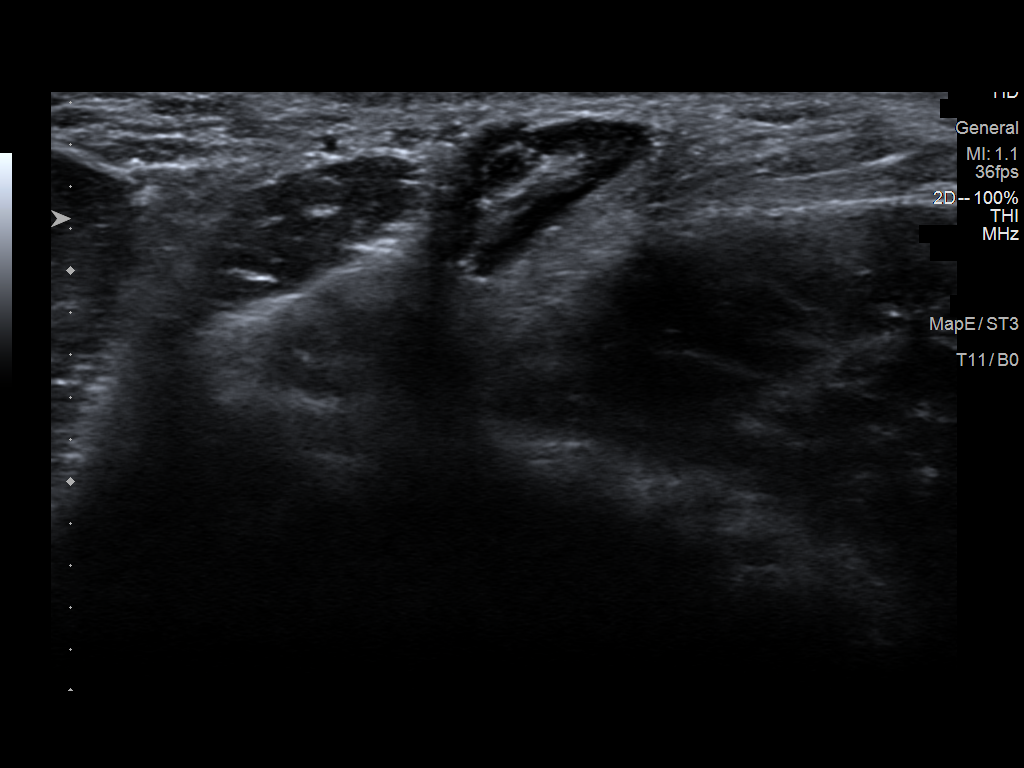
[im 3/6]
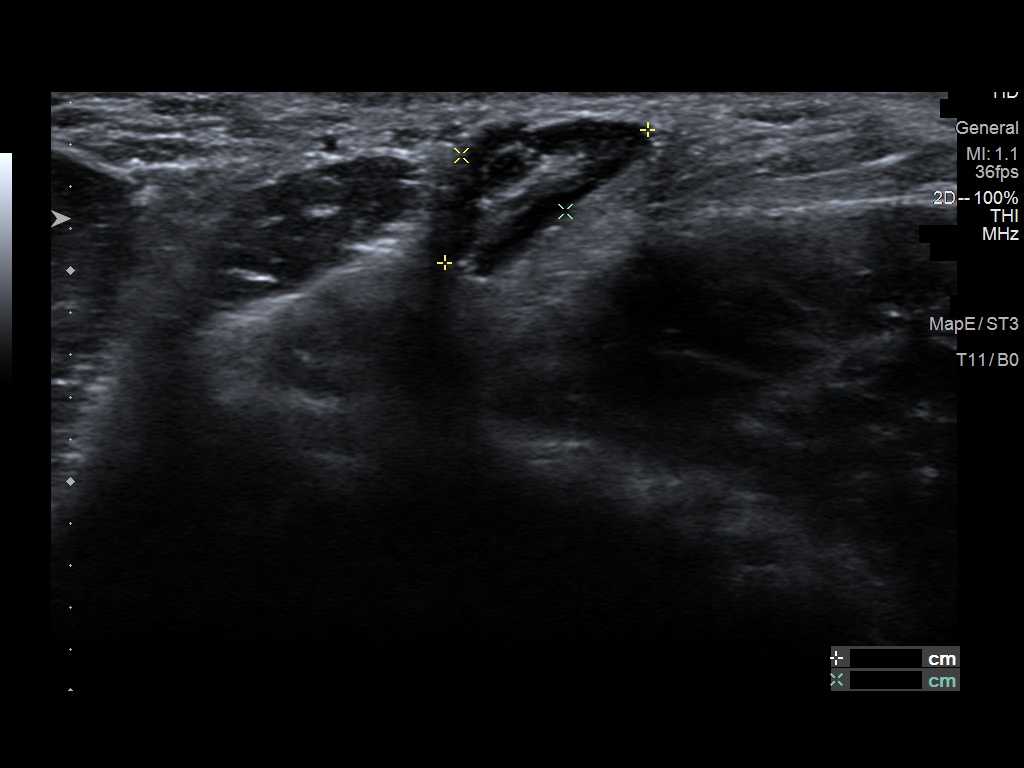
[im 4/6]
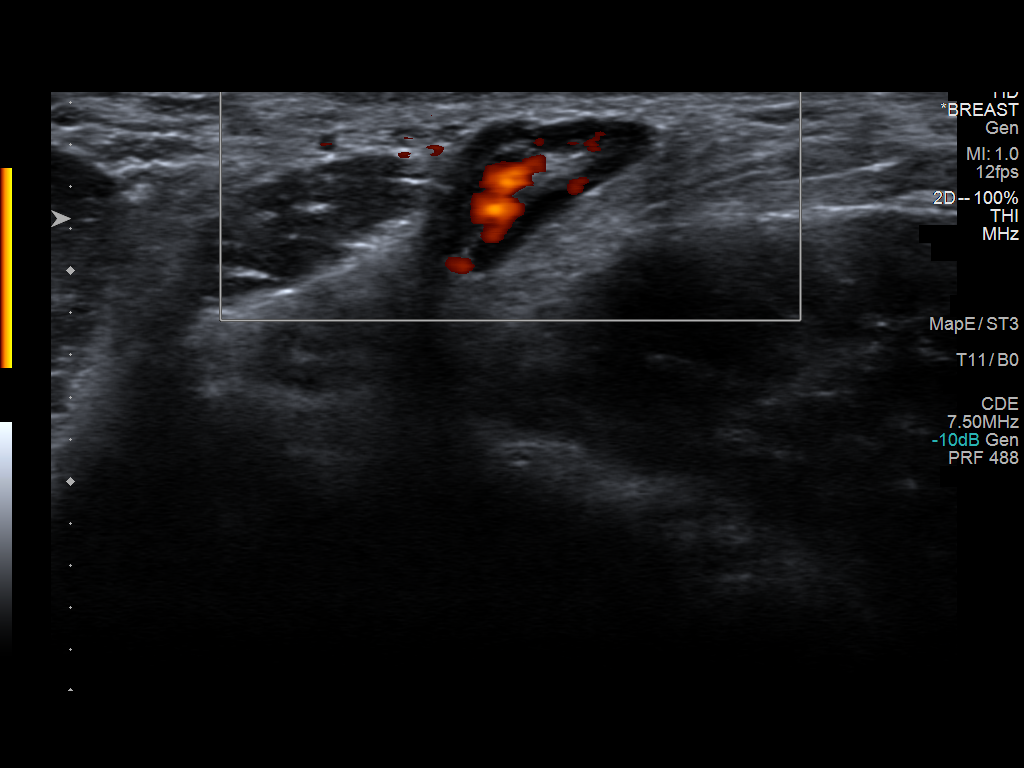
[im 5/6]
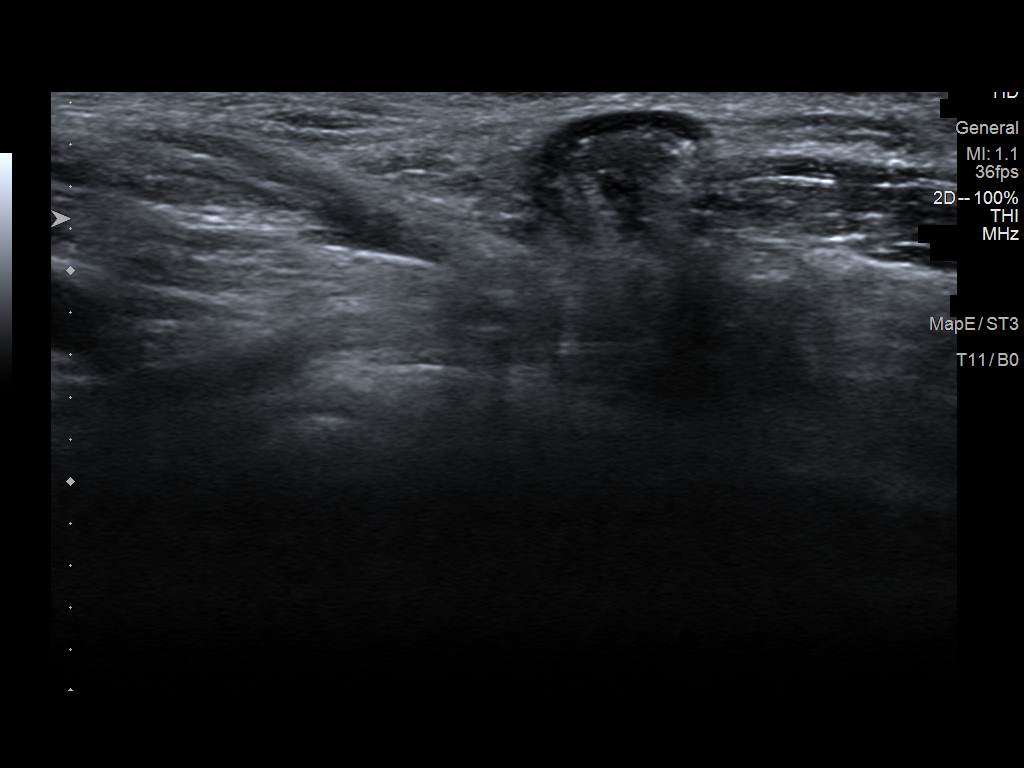
[im 6/6]
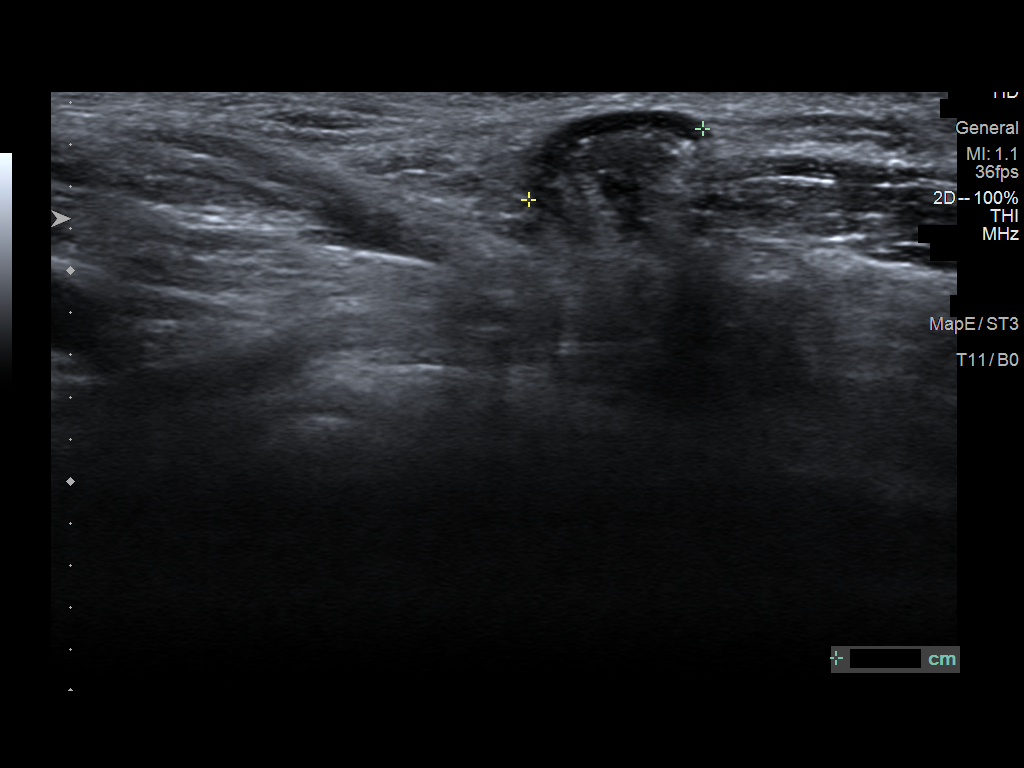

[6 of 6 positions shown; findings below may reference images not displayed]

ACR Breast Density Category d: The breast tissue is extremely dense,
which lowers the sensitivity of mammography.
FINDINGS: Mammogram:

Right breast: No suspicious mass, distortion, or microcalcifications
are identified to suggest presence of malignancy.

Left breast: No suspicious mass, distortion, or microcalcifications
are identified to suggest presence of malignancy.

A skin BB marks the site of concern reported by the patient in the
left axilla. A spot tangential view of this area is performed in
addition to standard views demonstrating a superficial circumscribed
oval mass which likely represents a lymph node.

Ultrasound:

Targeted ultrasound performed at the palpable site of concern in the
left axilla demonstrating a superficial oval circumscribed
hypoechoic mass with central fatty hilum consistent with a benign
lymph node. There is normal cortical thickness. No suspicious solid
mass or abnormal lymph node identified.
IMPRESSION: At the palpable site of concern in the left axilla there is a normal
lymph node.

RECOMMENDATION:
1.  Continued clinical surveillance of the bilateral breasts.

2.  Begin routine annual screening mammography at age 39.

I have discussed the findings and recommendations with the patient.
If applicable, a reminder letter will be sent to the patient
regarding the next appointment.

BI-RADS CATEGORY  2: Benign.
